# Patient Record
Sex: Male | Born: 1995 | Race: White | Hispanic: No | Marital: Single | Smoking: Never smoker
Health system: Southern US, Community
[De-identification: ages and names within clinical notes are randomized; demographics above are authoritative.]

## PROBLEM LIST (undated history)

## (undated) HISTORY — PX: CIRCUMCISION: SUR203

---
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
## 2016-05-30 IMAGING — CT CT HEAD W/O CM
2 series · 16 of 30 positions shown, 20 images · non-contrast
Comparison: Brain MRI 10/19/2012

CLINICAL DATA: Headache for 3 days with dizziness and blurry
vision.

EXAM:
CT HEAD WITHOUT CONTRAST
TECHNIQUE: Contiguous axial images were obtained from the base of the skull
through the vertex without intravenous contrast.

[Series 201: head w/o, idose (1) · axial · non-contrast · 0.49mm/px · z∈[+110,+235]mm · 13 of 31 slices shown, 17 images]
[im 3/31  brain]
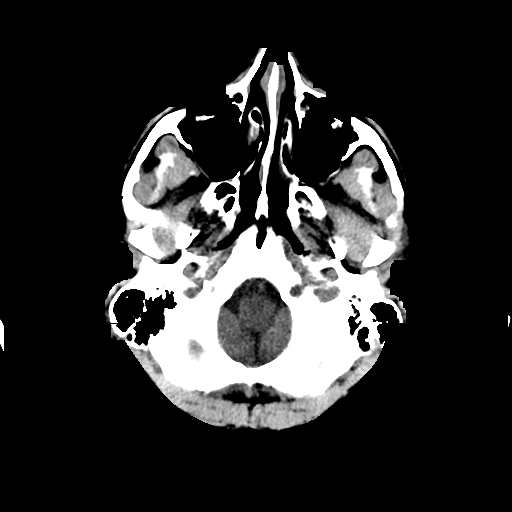
[im 3/31  bone]
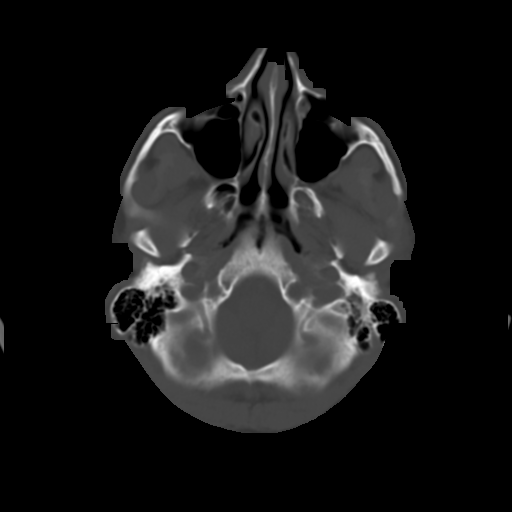
[im 5/31  brain]
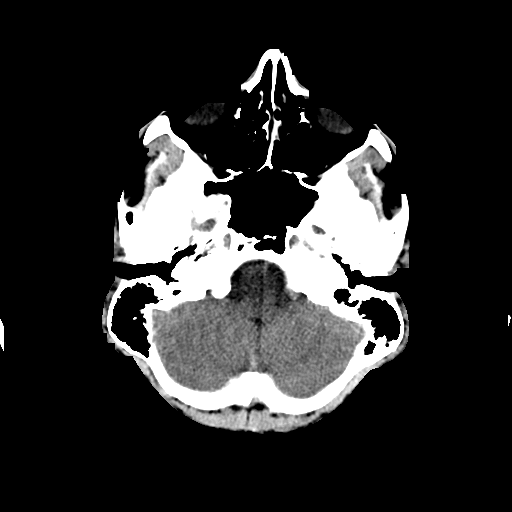
[im 7/31  brain]
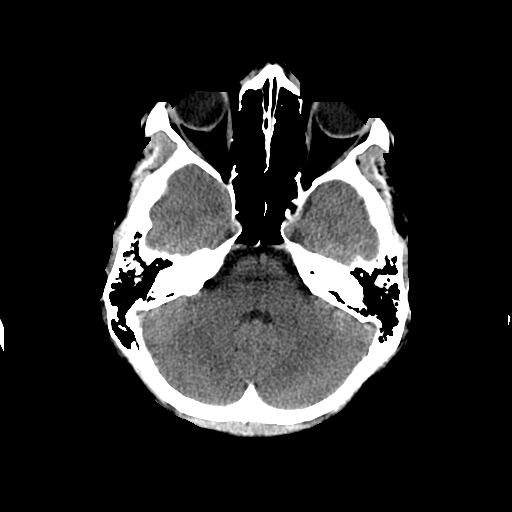
[im 9/31  brain]
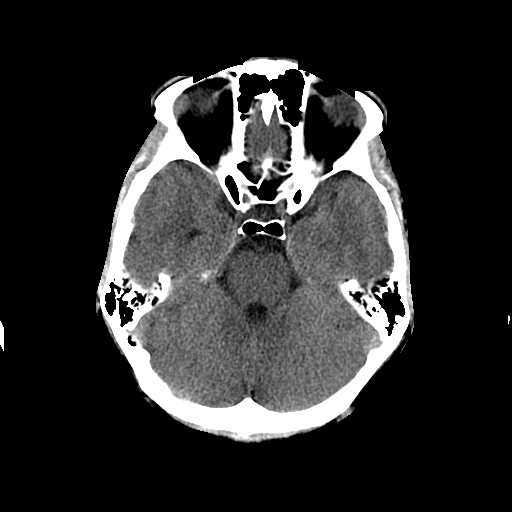
[im 11/31  brain]
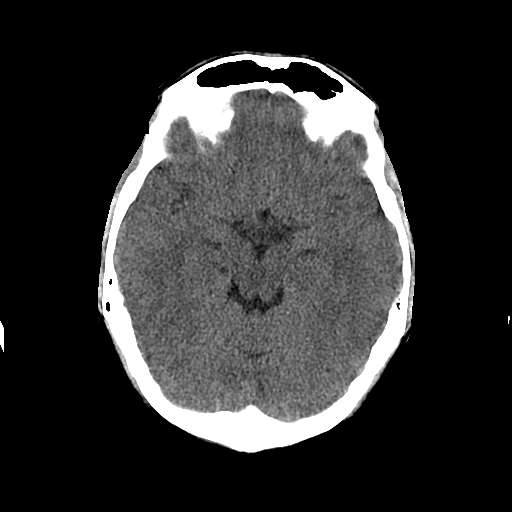
[im 11/31  bone]
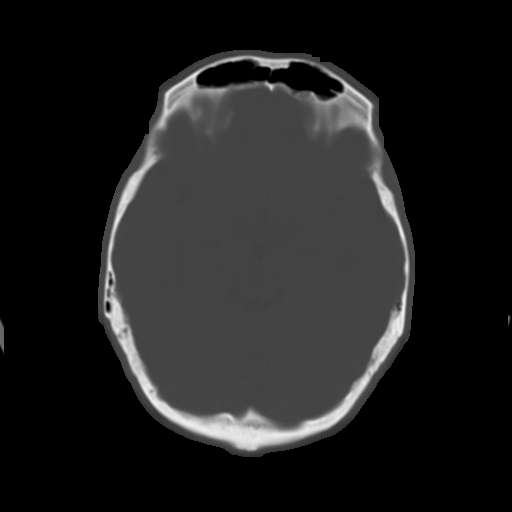
[im 13/31  brain]
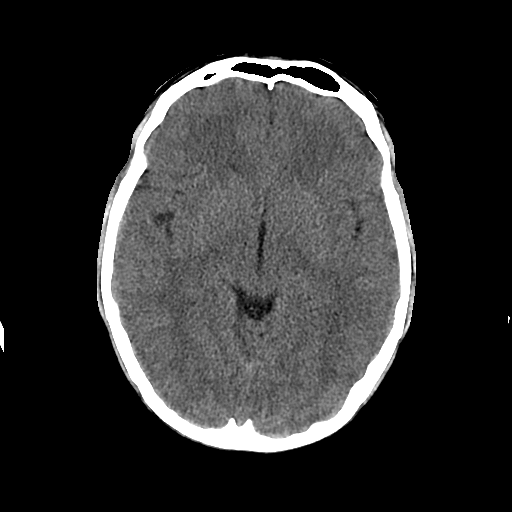
[im 16/31  brain]
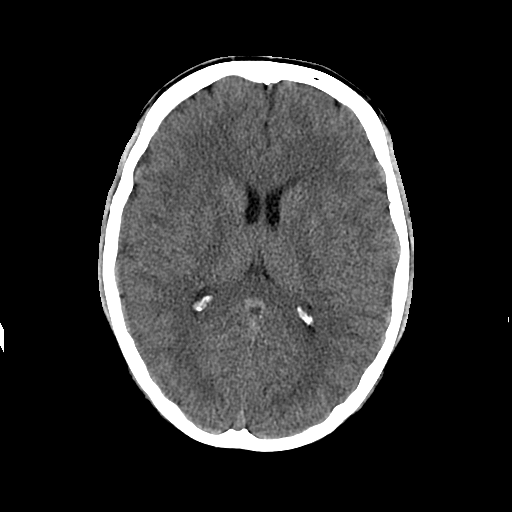
[im 18/31  brain]
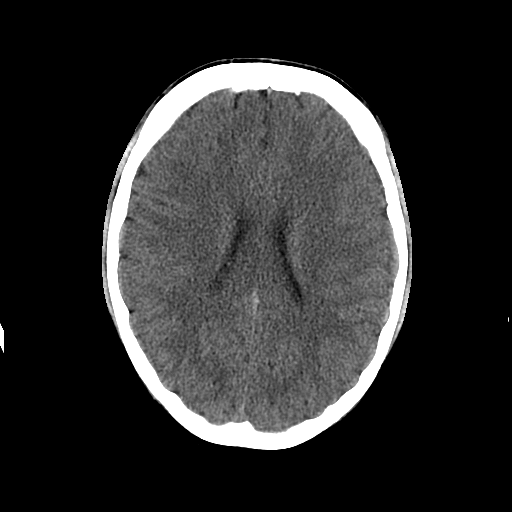
[im 20/31  brain]
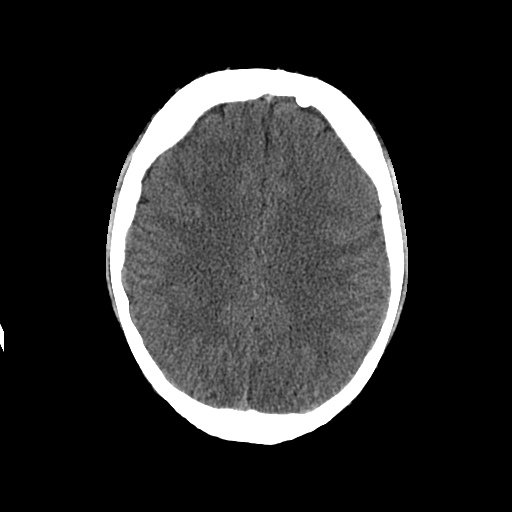
[im 20/31  bone]
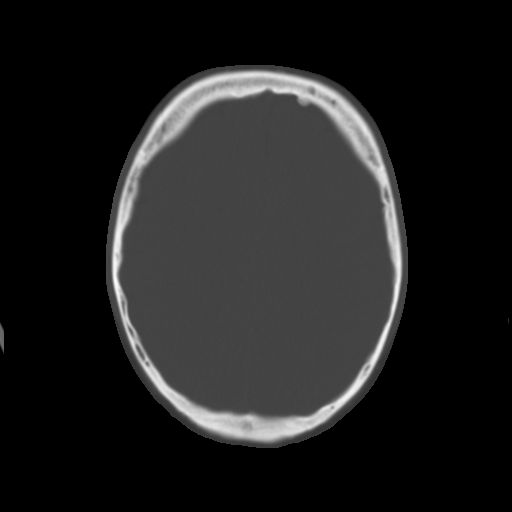
[im 22/31  brain]
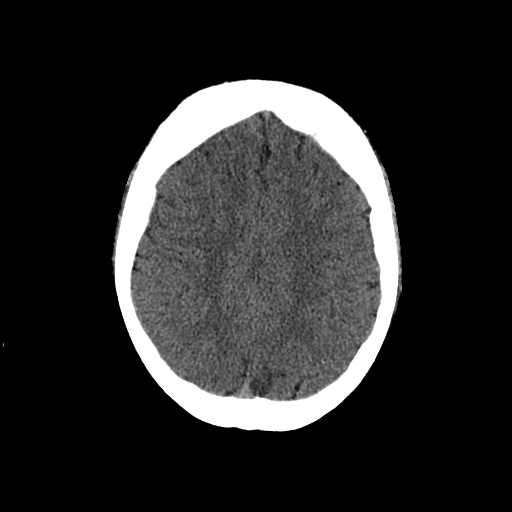
[im 24/31  brain]
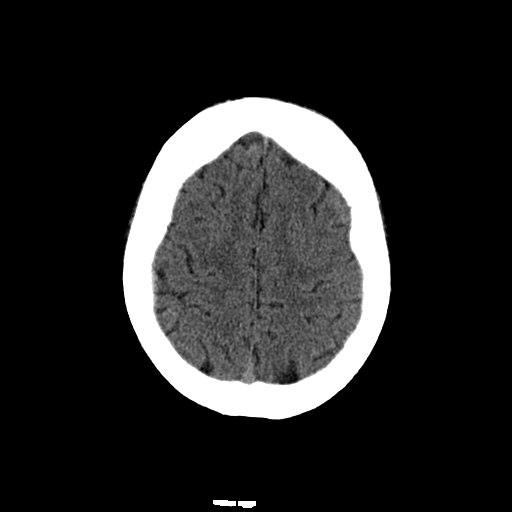
[im 26/31  brain]
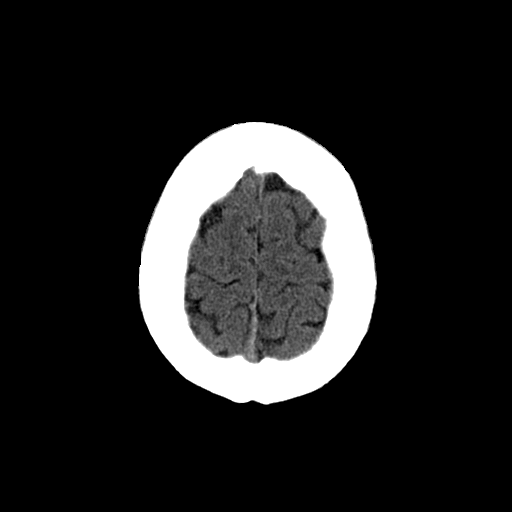
[im 28/31  brain]
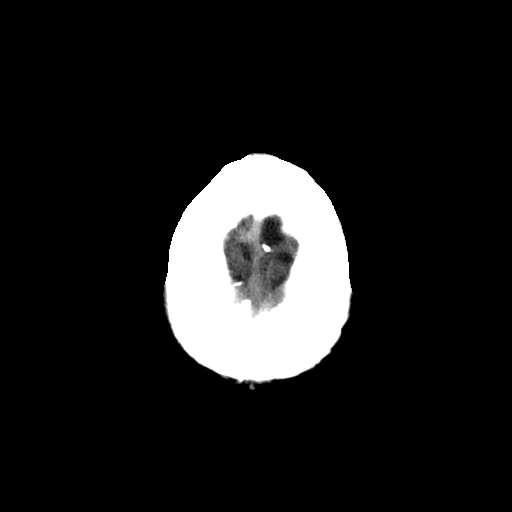
[im 28/31  bone]
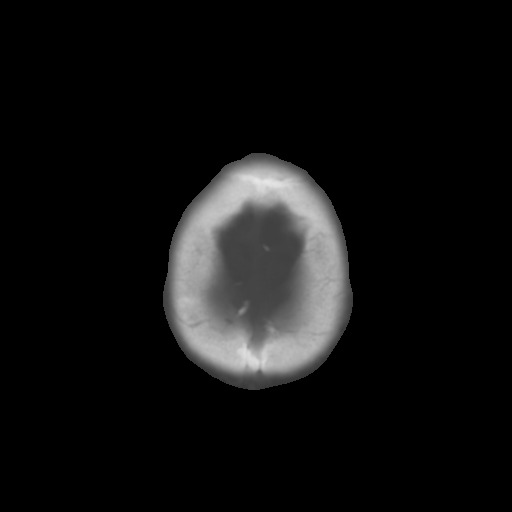

[Series 202: head w/o bone, idose (1) · axial · non-contrast · 0.49mm/px · z∈[+110,+150]mm · 3 of 31 slices shown]
[im 3/31  bone]
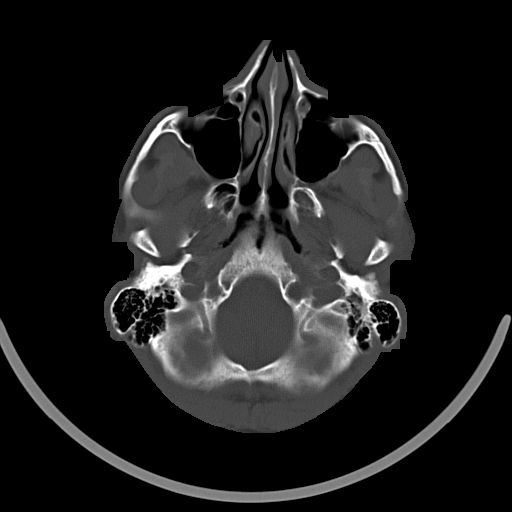
[im 7/31  bone]
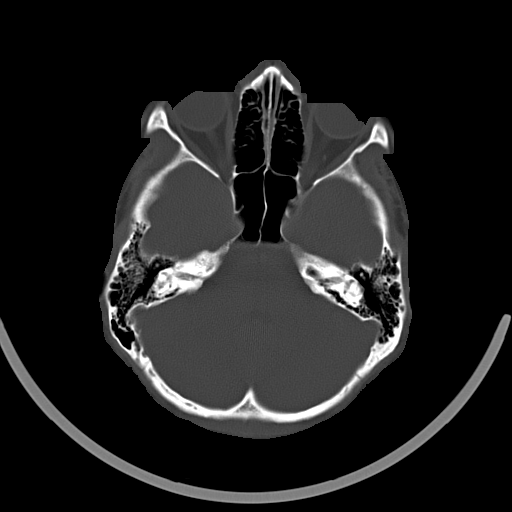
[im 11/31  bone]
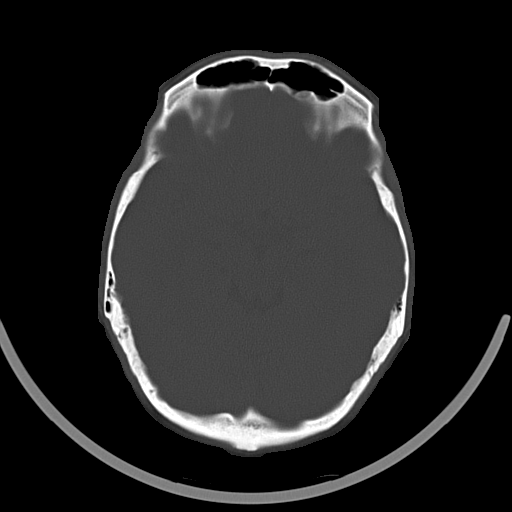

[16 of 30 positions shown; findings below may reference images not displayed]

FINDINGS: No intracranial hemorrhage, mass effect, or midline shift. No
hydrocephalus. The basilar cisterns are patent. No evidence of
territorial infarct. No intracranial fluid collection. Calvarium is
intact. Included paranasal sinuses and mastoid air cells are well
aerated.
IMPRESSION: Normal noncontrast head CT.

## 2019-05-06 DEATH — deceased
